# Patient Record
Sex: Male | Born: 1995 | Race: White | Hispanic: No | Marital: Single | Smoking: Current every day smoker
Health system: Southern US, Community
[De-identification: ages and names within clinical notes are randomized; demographics above are authoritative.]

---
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
## 2017-07-11 ENCOUNTER — Other Ambulatory Visit: Payer: Self-pay

## 2017-07-11 ENCOUNTER — Encounter (HOSPITAL_COMMUNITY): Payer: Self-pay | Admitting: Emergency Medicine

## 2017-07-11 ENCOUNTER — Emergency Department (HOSPITAL_COMMUNITY)
Admission: EM | Admit: 2017-07-11 | Discharge: 2017-07-11 | Disposition: A | Discharge status: Home / Self Care | Payer: Self-pay | Attending: Emergency Medicine | Admitting: Emergency Medicine

## 2017-07-11 DIAGNOSIS — Z23 Encounter for immunization: Secondary | ICD-10-CM | POA: Insufficient documentation

## 2017-07-11 DIAGNOSIS — S30861A Insect bite (nonvenomous) of abdominal wall, initial encounter: Secondary | ICD-10-CM

## 2017-07-11 DIAGNOSIS — L0100 Impetigo, unspecified: Secondary | ICD-10-CM | POA: Insufficient documentation

## 2017-07-11 DIAGNOSIS — L234 Allergic contact dermatitis due to dyes: Secondary | ICD-10-CM | POA: Insufficient documentation

## 2017-07-11 DIAGNOSIS — W57XXXA Bitten or stung by nonvenomous insect and other nonvenomous arthropods, initial encounter: Secondary | ICD-10-CM | POA: Insufficient documentation

## 2017-07-11 DIAGNOSIS — F17210 Nicotine dependence, cigarettes, uncomplicated: Secondary | ICD-10-CM | POA: Insufficient documentation

## 2017-07-11 MED ORDER — MUPIROCIN CALCIUM 2 % EX CREA: 1.0000 "application " | TOPICAL_CREAM | Freq: Two times a day (BID) | CUTANEOUS | 0 refills | Status: AC

## 2017-07-11 MED ORDER — TETANUS-DIPHTH-ACELL PERTUSSIS 5-2.5-18.5 LF-MCG/0.5 IM SUSP
0.5000 mL | Freq: Once | INTRAMUSCULAR | Status: AC
  Administered 2017-07-11: 0.5 mL via INTRAMUSCULAR
  Filled 2017-07-11: qty 0.5

## 2017-07-11 MED ORDER — DOXYCYCLINE HYCLATE 100 MG PO CAPS: 100.0000 mg | ORAL_CAPSULE | Freq: Two times a day (BID) | ORAL | 0 refills | Status: AC

## 2017-07-11 MED ORDER — PREDNISONE 20 MG PO TABS: ORAL_TABLET | ORAL | 0 refills | Status: AC

## 2017-07-11 NOTE — Discharge Instructions (Signed)
Please apply mupirocin to rash twice daily for 7 days.  Take doxycycline for 10 days, take prednisone for 4 days.  Return if you notice no improvement.

## 2017-07-11 NOTE — ED Provider Notes (Signed)
[facility] EMERGENCY DEPARTMENT Provider Note   CSN: [id number] Arrival date & time: 07/11/17  1956     History   Chief Complaint Chief Complaint  Patient presents with  . Rash    HPI Larry Burnett is a 22 y.o. male.  HPI   22 year old male presenting for evaluation of a rash.  Patient noticed a small take on his bellybutton approximately 1 week ago.  He believes that it may have been on his skin for approximately 4 to 5 hours.  It was not engorged he was able to remove it without difficulty.  He noticed an itchy rash to his left ear that has now spread to the left side of his scalp and neck oozing out fluid.  Denies any significant associate pain with that.  He also noticed a rash around his left lower abdomen along a tattoo that he had recently re-touch approximately a week ago.  Rash is itchy as well.  He denies any severe headache, fever, throat swelling, chest pain, trouble breathing, abdominal cramping.  No specific treatment tried.  Remote history of [place] spotted fever and he was concerned about his takeback.  He is not up-to-date with tetanus.  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  History reviewed. No pertinent past medical history.  There are no active problems to display for this patient.   Past Surgical History:  Procedure Laterality Date  . WISDOM TOOTH EXTRACTION  2017        Home Medications    Prior to Admission medications   Not on File    Family History History reviewed. No pertinent family history.  Social History Social History   Tobacco Use  . Smoking status: Current Every Day Smoker    Packs/day: 0.50    Years: 5.00    Pack years: 2.50    Types: Cigarettes  . Smokeless tobacco: Never Used  Substance Use Topics  . Alcohol use: Not Currently  . Drug use: Not Currently     Allergies   Patient has no known allergies.   Review of Systems Review of Systems  All  other systems reviewed and are negative.    Physical Exam Updated Vital Signs BP (!) 134/50 (BP Location: Right Arm)   Pulse 95   Temp 98.2 F (36.8 C) (Oral)   Resp 18   Ht 5\' 11"  (1.803 m)   Wt 59 kg (130 lb)   SpO2 98%   BMI 18.13 kg/m   Physical Exam  Constitutional: He appears well-developed and well-nourished. No distress.  HENT:  Head: Normocephalic and atraumatic.  Honey colored crust and surrounding skin erythema noted to left earlobe and extension to the postauricular region, posterior scalp and left side of neck with lymphadenopathy.  Left TM normal in appearance.  No tenderness to palpation.  Eyes: Conjunctivae are normal.  Neck: Neck supple.  Cardiovascular: Normal rate and regular rhythm.  Pulmonary/Chest: Effort normal.  Neurological: He is alert.  Skin: Rash (Umbilicus without any signs of skin infection.  There is a tattoo to the left lower anterior abdomen with surrounding skin irritation following the margin of the tattoo.  It is nontender to palpation.) noted.  Psychiatric: He has a normal mood and affect.  Nursing note and vitals reviewed.    ED Treatments / Results  Labs (all labs ordered are listed, but only abnormal results are displayed) Labs Reviewed - No data to display  EKG None  Radiology No results found.  Procedures Procedures (including critical care time)  Medications Ordered in ED Medications  Tdap (BOOSTRIX) injection 0.5 mL (has no administration in time range)     Initial Impression / Assessment and Plan / ED Course  I have reviewed the triage vital signs and the nursing notes.  Pertinent labs & imaging results that were available during my care of the patient were reviewed by me and considered in my medical decision making (see chart for details).     BP (!) 134/50 (BP Location: Right Arm)   Pulse 95   Temp 98.2 F (36.8 C) (Oral)   Resp 18   Ht 5\' 11"  (1.803 m)   Wt 59 kg (130 lb)   SpO2 98%   BMI 18.13 kg/m      Final Clinical Impressions(s) / ED Diagnoses   Final diagnoses:  Impetigo  Tick bite of abdomen, initial encounter  Allergic contact dermatitis due to dyes    ED Discharge Orders        Ordered    mupirocin cream (BACTROBAN) 2 %  2 times daily     07/11/17 2055    doxycycline (VIBRAMYCIN) 100 MG capsule  2 times daily     07/11/17 2055    predniSONE (DELTASONE) 20 MG tablet     07/11/17 2055     8:52 PM Patient reports tick bite a week ago.  No fever, joint pain, headache.  Tick may have been on the skin for less than 5 hours therefore I have low suspicion for tickborne illness.  He has localized skin irritation along his tattoo that he recently had a touchup.  Suspect contact dermatitis from Inc.  He also has a honey colored crust rash throughout his left side of scalp and ear concerning for impetigo.  Patient will be discharged home with prednisone, doxycycline, and mupirocin as treatment.  Will update his tetanus.  No systemic manifestation.  He is stable for discharge.  Return precautions discussed.   Larry Burnett, Larry Meding, PA-C 07/11/17 2056    Larry JesterMcManus, Kathleen, DO 07/12/17 1546

## 2017-07-11 NOTE — ED Triage Notes (Signed)
Pt c/o rash to left ear and abdomen x 1 week, pt reports abdominal rash is following a tick bite, unsure about origination of rash to left ear

## 2017-07-13 ENCOUNTER — Emergency Department (HOSPITAL_COMMUNITY)
Admission: EM | Admit: 2017-07-13 | Discharge: 2017-07-13 | Disposition: A | Discharge status: Home / Self Care | Payer: Self-pay | Attending: Emergency Medicine | Admitting: Emergency Medicine

## 2017-07-13 ENCOUNTER — Encounter (HOSPITAL_COMMUNITY): Payer: Self-pay | Admitting: Emergency Medicine

## 2017-07-13 DIAGNOSIS — Y929 Unspecified place or not applicable: Secondary | ICD-10-CM | POA: Insufficient documentation

## 2017-07-13 DIAGNOSIS — S0501XA Injury of conjunctiva and corneal abrasion without foreign body, right eye, initial encounter: Secondary | ICD-10-CM

## 2017-07-13 DIAGNOSIS — Y990 Civilian activity done for income or pay: Secondary | ICD-10-CM | POA: Insufficient documentation

## 2017-07-13 DIAGNOSIS — Y9389 Activity, other specified: Secondary | ICD-10-CM | POA: Insufficient documentation

## 2017-07-13 DIAGNOSIS — F17210 Nicotine dependence, cigarettes, uncomplicated: Secondary | ICD-10-CM | POA: Insufficient documentation

## 2017-07-13 DIAGNOSIS — X58XXXA Exposure to other specified factors, initial encounter: Secondary | ICD-10-CM | POA: Insufficient documentation

## 2017-07-13 MED ORDER — TETRACAINE HCL 0.5 % OP SOLN
OPHTHALMIC | Status: AC
  Administered 2017-07-13: 17:00:00
  Filled 2017-07-13: qty 4

## 2017-07-13 MED ORDER — POLYMYXIN B-TRIMETHOPRIM 10000-0.1 UNIT/ML-% OP SOLN: 1.0000 [drp] | Freq: Four times a day (QID) | OPHTHALMIC | 0 refills | Status: AC

## 2017-07-13 MED ORDER — FLUORESCEIN SODIUM 1 MG OP STRP
ORAL_STRIP | OPHTHALMIC | Status: AC
  Administered 2017-07-13: 1
  Filled 2017-07-13: qty 1

## 2017-07-13 NOTE — ED Notes (Signed)
Right eye irrigation complete. Patient tolerated well.

## 2017-07-13 NOTE — ED Triage Notes (Signed)
Pt states he got hit in the right eye yesterday with wet cement.  States his eye continues to water and is red, but vision is unaffected.

## 2017-07-13 NOTE — Discharge Instructions (Addendum)
Follow-up with an eye doctor if your symptoms do not resolved in the next 24 to 48 hours, apply the antibiotic eyedrops daily

## 2017-07-13 NOTE — ED Provider Notes (Signed)
[facility] EMERGENCY DEPARTMENT Provider Note   CSN: [id number] Arrival date & time: 07/13/17  1520     History   Chief Complaint Chief Complaint  Patient presents with  . Eye Problem    HPI Larry Burnett is a 22 y.o. male.  HPI Patient presents to the emergency room for evaluation of eye discomfort and irritation.  Patient was at work yesterday when some wet concrete splashed in his eye.  Patient states he immediately irrigated his eye out but he continued to have some irritation.  When he went home he was too irritated to try to flush out anymore.  When he woke up this morning he had a lot of crusted discharge.  Patient is not having any difficulty seeing.  His eye continues to feel irritated and looks red.  He does not feel any foreign body. History reviewed. No pertinent past medical history.  There are no active problems to display for this patient.   Past Surgical History:  Procedure Laterality Date  . WISDOM TOOTH EXTRACTION  2017        Home Medications    Prior to Admission medications   Medication Sig Start Date End Date Taking? Authorizing Provider  doxycycline (VIBRAMYCIN) 100 MG capsule Take 1 capsule (100 mg total) by mouth 2 (two) times daily. One po bid x 7 days 07/11/17   Fayrene Helperran, Bowie, PA-C  mupirocin cream (BACTROBAN) 2 % Apply 1 application topically 2 (two) times daily. Apply to rash twice daily for 7 days. 07/11/17   Fayrene Helperran, Bowie, PA-C  predniSONE (DELTASONE) 20 MG tablet 2 tabs po daily x 4 days 07/11/17   Fayrene Helperran, Bowie, PA-C  trimethoprim-polymyxin b (POLYTRIM) ophthalmic solution Place 1 drop into the right eye every 6 (six) hours. 07/13/17   Linwood DibblesKnapp, Larue Drawdy, MD    Family History History reviewed. No pertinent family history.  Social History Social History   Tobacco Use  . Smoking status: Current Every Day Smoker    Packs/day: 0.50    Years: 5.00    Pack years: 2.50    Types: Cigarettes  . Smokeless tobacco: Never Used  Substance Use Topics  .  Alcohol use: Not Currently  . Drug use: Not Currently     Allergies   Azithromycin   Review of Systems Review of Systems  All other systems reviewed and are negative.    Physical Exam Updated Vital Signs BP 135/73 (BP Location: Right Arm)   Pulse (!) 102   Temp 98.9 F (37.2 C) (Oral)   Resp 18   Ht 1.803 m (5\' 11" )   Wt 59 kg (130 lb)   SpO2 97%   BMI 18.13 kg/m   Physical Exam  Constitutional: He appears well-developed and well-nourished. No distress.  HENT:  Head: Normocephalic and atraumatic.  Right Ear: External ear normal.  Left Ear: External ear normal.  Eyes: Pupils are equal, round, and reactive to light. EOM are normal. Right eye exhibits chemosis. Right eye exhibits no discharge and no exudate. No foreign body present in the right eye. Left eye exhibits no discharge. Right conjunctiva is injected. Left conjunctiva is not injected. No scleral icterus.  Right eye examined with fluorescein stain and black light, eyelids everted, no evidence of foreign body, corneal abrasion adjacent to the pupil in the midline on the ninth  Neck: Neck supple. No tracheal deviation present.  Cardiovascular: Normal rate.  Pulmonary/Chest: Effort normal. No stridor. No respiratory distress.  Abdominal: He exhibits no distension.  Musculoskeletal: He  exhibits no edema.  Neurological: He is alert. Cranial nerve deficit: no gross deficits.  Skin: Skin is warm and dry. No rash noted.  Psychiatric: He has a normal mood and affect.  Nursing note and vitals reviewed.    ED Treatments / Results  Labs (all labs ordered are listed, but only abnormal results are displayed) Labs Reviewed - No data to display  EKG None  Radiology No results found.  Procedures Procedures (including critical care time)  Medications Ordered in ED Medications  fluorescein 1 MG ophthalmic strip (1 strip  Given 07/13/17 1646)  tetracaine (PONTOCAINE) 0.5 % ophthalmic solution (  Given by Other  07/13/17 1646)     Initial Impression / Assessment and Plan / ED Course  I have reviewed the triage vital signs and the nursing notes.  Pertinent labs & imaging results that were available during my care of the patient were reviewed by me and considered in my medical decision making (see chart for details).    presented to the emergency room for evaluation after foreign body to the right eye.  Exam is notable for corneal abrasion.  No evidence of retained foreign body.  Patient's eye was irrigated.  Discharged home with antibiotic eyedrops and outpatient follow-up with ophthalmology if the symptoms have not resolved in the next 24 to 48 hours.  Final Clinical Impressions(s) / ED Diagnoses   Final diagnoses:  Abrasion of right cornea, initial encounter    ED Discharge Orders        Ordered    trimethoprim-polymyxin b (POLYTRIM) ophthalmic solution  Every 6 hours     07/13/17 1810       Linwood Dibbles, MD 07/13/17 1812
